# Patient Record
Sex: Female | Born: 2000 | Race: White | Hispanic: No | Marital: Single | Smoking: Never smoker
Health system: Southern US, Community
[De-identification: ages and names within clinical notes are randomized; demographics above are authoritative.]

---
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
## 2021-04-22 ENCOUNTER — Encounter (INDEPENDENT_AMBULATORY_CARE_PROVIDER_SITE_OTHER): Payer: Self-pay

## 2021-05-12 DIAGNOSIS — L709 Acne, unspecified: Secondary | ICD-10-CM | POA: Diagnosis not present

## 2021-05-12 DIAGNOSIS — Z01419 Encounter for gynecological examination (general) (routine) without abnormal findings: Secondary | ICD-10-CM | POA: Diagnosis not present

## 2021-05-12 DIAGNOSIS — Z6821 Body mass index (BMI) 21.0-21.9, adult: Secondary | ICD-10-CM | POA: Diagnosis not present

## 2021-11-02 DIAGNOSIS — B349 Viral infection, unspecified: Secondary | ICD-10-CM | POA: Diagnosis not present

## 2021-11-02 DIAGNOSIS — J039 Acute tonsillitis, unspecified: Secondary | ICD-10-CM | POA: Diagnosis not present

## 2021-11-02 DIAGNOSIS — Z20822 Contact with and (suspected) exposure to covid-19: Secondary | ICD-10-CM | POA: Diagnosis not present

## 2021-11-02 DIAGNOSIS — H1089 Other conjunctivitis: Secondary | ICD-10-CM | POA: Diagnosis not present
